# Patient Record
Sex: Male | Born: 1975 | Race: Black or African American | Hispanic: No | Marital: Single | Smoking: Never smoker
Health system: Southern US, Community
[De-identification: ages and names within clinical notes are randomized; demographics above are authoritative.]

## PROBLEM LIST (undated history)

## (undated) DIAGNOSIS — I10 Essential (primary) hypertension: Secondary | ICD-10-CM

## (undated) HISTORY — PX: ADENOIDECTOMY: SUR15

## (undated) HISTORY — PX: PALATE / UVULA BIOPSY / EXCISION: SUR128

---

## 2017-11-17 ENCOUNTER — Emergency Department (HOSPITAL_BASED_OUTPATIENT_CLINIC_OR_DEPARTMENT_OTHER): Payer: Managed Care, Other (non HMO)

## 2017-11-17 ENCOUNTER — Other Ambulatory Visit: Payer: Self-pay

## 2017-11-17 ENCOUNTER — Emergency Department (HOSPITAL_BASED_OUTPATIENT_CLINIC_OR_DEPARTMENT_OTHER)
Admission: EM | Admit: 2017-11-17 | Discharge: 2017-11-17 | Disposition: A | Discharge status: Home / Self Care | Payer: Managed Care, Other (non HMO) | Attending: Emergency Medicine | Admitting: Emergency Medicine

## 2017-11-17 ENCOUNTER — Encounter (HOSPITAL_BASED_OUTPATIENT_CLINIC_OR_DEPARTMENT_OTHER): Payer: Self-pay | Admitting: Emergency Medicine

## 2017-11-17 DIAGNOSIS — S93402A Sprain of unspecified ligament of left ankle, initial encounter: Secondary | ICD-10-CM | POA: Diagnosis not present

## 2017-11-17 DIAGNOSIS — Y929 Unspecified place or not applicable: Secondary | ICD-10-CM | POA: Insufficient documentation

## 2017-11-17 DIAGNOSIS — X509XXA Other and unspecified overexertion or strenuous movements or postures, initial encounter: Secondary | ICD-10-CM | POA: Insufficient documentation

## 2017-11-17 DIAGNOSIS — Y9384 Activity, sleeping: Secondary | ICD-10-CM | POA: Insufficient documentation

## 2017-11-17 DIAGNOSIS — Y999 Unspecified external cause status: Secondary | ICD-10-CM | POA: Diagnosis not present

## 2017-11-17 DIAGNOSIS — S99912A Unspecified injury of left ankle, initial encounter: Secondary | ICD-10-CM | POA: Diagnosis present

## 2017-11-17 DIAGNOSIS — I10 Essential (primary) hypertension: Secondary | ICD-10-CM | POA: Insufficient documentation

## 2017-11-17 HISTORY — DX: Essential (primary) hypertension: I10

## 2017-11-17 HISTORY — DX: Morbid (severe) obesity due to excess calories: E66.01

## 2017-11-17 MED ORDER — IBUPROFEN 200 MG PO TABS
600.0000 mg | ORAL_TABLET | Freq: Once | ORAL | Status: AC
  Administered 2017-11-17: 600 mg via ORAL
  Filled 2017-11-17: qty 1

## 2017-11-17 NOTE — Discharge Instructions (Signed)
Injury is likely musculoskeletal, likely an ankle sprain.  Please keep ankle wrapped in Ace bandage, elevate as much as possible, you can also apply ice.  The more you are able to stay off of your ankle though faster will likely improve.  If you have worsening swelling, pain in your calf, or on the inside of your ankle, or symptoms are not improving please follow-up.  May call to schedule an appointment with Dr. Norton BlizzardShane Hudnall with sports medicine.  If you have significantly worsened swelling, pain, or any shortness of breath or chest pain please return to the ED immediately.

## 2017-11-17 NOTE — ED Notes (Signed)
PMS intact before and after. Pt tolerated well. All questions answered. 
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
## 2017-11-17 NOTE — ED Provider Notes (Signed)
[facility] EMERGENCY DEPARTMENT Provider Note   CSN: [id number] Arrival date & time: 11/17/17  1956     History   Chief Complaint Chief Complaint  Patient presents with  . Leg Pain    HPI  Jeffrey Newton is a 41 y.o. Male history of hypertension and morbid obesity, presents complaining of left ankle pain for the past 2 weeks.  Patient reports he fell asleep in a kneeling position 2 weeks ago and since then his ankle has not felt right.  Pain localized primarily over the lateral malleolus and top of the foot.  Reports pain with range of motion and weightbearing.  Swelling over the lateral malleolus.  He denies swelling or pain in the calf, no pain at the knee or in the upper leg.  Denies numbness or tingling.  Tried ibuprofen with some improvement.  Has continued to walk on the leg, reports he is on his feet most of the day for work.      Past Medical History:  Diagnosis Date  . Hypertension   . Morbid obesity (HCC)     There are no active problems to display for this patient.   Past Surgical History:  Procedure Laterality Date  . ADENOIDECTOMY    . PALATE / UVULA BIOPSY / EXCISION         Home Medications    Prior to Admission medications   Not on File    Family History No family history on file.  Social History Social History   Tobacco Use  . Smoking status: Never Smoker  . Smokeless tobacco: Never Used  Substance Use Topics  . Alcohol use: Yes    Comment: occ  . Drug use: No     Allergies   Patient has no known allergies.   Review of Systems Review of Systems  Constitutional: Negative for chills and fever.  Musculoskeletal: Positive for arthralgias (L ankle).  Neurological: Negative for weakness and numbness.     Physical Exam Updated Vital Signs BP 136/84   Pulse 97   Temp 98 F (36.7 C) (Oral)   Resp 20   Ht 5\' 9"  (1.753 m)   Wt (!) 208.7 kg (460 lb)   SpO2 96%   BMI 67.93 kg/m   Physical Exam  Constitutional: He  is oriented to person, place, and time. He appears well-developed and well-nourished. No distress.  HENT:  Head: Normocephalic and atraumatic.  Eyes: Right eye exhibits no discharge. Left eye exhibits no discharge.  Cardiovascular:  Pulses:      Dorsalis pedis pulses are 2+ on the right side, and 2+ on the left side.       Posterior tibial pulses are 2+ on the right side, and 2+ on the left side.  Pulmonary/Chest: Effort normal. No respiratory distress.  Musculoskeletal:  Tenderness and swelling over left lateral malleolus, no tenderness or swelling over the medial malleolus calcaneus, Achilles tendon intact, no obvious joint laxity, dorsi and plantar flexion intact with some discomfort, DP and TP pulses 2+ and equal bilaterally, sensation intact and equal bilaterally  Neurological: He is alert and oriented to person, place, and time. Coordination normal.  Skin: Skin is warm and dry. Capillary refill takes less than 2 seconds. He is not diaphoretic.  Psychiatric: He has a normal mood and affect. His behavior is normal.  Nursing note and vitals reviewed.    ED Treatments / Results  Labs (all labs ordered are listed, but only abnormal results are displayed) Labs Reviewed -  No data to display  EKG  EKG Interpretation None       Radiology Dg Ankle Complete Left  Result Date: 11/17/2017 CLINICAL DATA:  Ankle pain for 1-2 weeks, no known injury, initial encounter EXAM: LEFT ANKLE COMPLETE - 3+ VIEW COMPARISON:  None. FINDINGS: Generalized soft tissue swelling is noted about the ankle joint. No acute fracture or dislocation is seen. Achilles spurring is noted. Mild degenerative changes of the tarsal bones are seen. IMPRESSION: Generalized soft tissue swelling without acute bony abnormality. Electronically Signed   By: Alcide CleverMark  Lukens M.D.   On: 11/17/2017 21:58    Procedures Procedures (including critical care time)  Medications Ordered in ED Medications  ibuprofen (ADVIL,MOTRIN)  tablet 600 mg (600 mg Oral Given 11/17/17 2154)     Initial Impression / Assessment and Plan / ED Course  I have reviewed the triage vital signs and the nursing notes.  Pertinent labs & imaging results that were available during my care of the patient were reviewed by me and considered in my medical decision making (see chart for details).  Patient presents with 2 weeks of left ankle pain and swelling over the lateral malleolus.  Patient reports he fell asleep in a kneeling position and has been hurting since then.  Left lower extremity is neurovascularly intact, no swelling over the medial malleolus, equal pulses, low suspicion for DVT.  X-Ray neg for fracture but does show soft tissue swelling.  Likely ankle sprain as tenderness is primarily over the anterior talofibular ligament distribution, will place patient in an Ace wrap, recommended crutches but patient refuses.  Pain improving with ibuprofen here in the ED.  Encourage rice therapy and NSAIDs.  Patient to follow-up with Dr. Norton BlizzardShane Hudnall if symptoms are not improving.  Discussed return precautions regarding DVT.  Patient expresses understanding and is in agreement with plan.  Final Clinical Impressions(s) / ED Diagnoses   Final diagnoses:  Sprain of left ankle, unspecified ligament, initial encounter    ED Discharge Orders    None       Dartha LodgeFord, Travanti Mcmanus N, [place]PA-C 11/18/17 [id number]    Alvira MondaySchlossman, Erin, MD 11/19/17 [id number]

## 2017-12-17 ENCOUNTER — Encounter: Payer: Self-pay | Admitting: Physician Assistant

## 2017-12-17 ENCOUNTER — Other Ambulatory Visit: Payer: Self-pay

## 2017-12-17 ENCOUNTER — Ambulatory Visit (INDEPENDENT_AMBULATORY_CARE_PROVIDER_SITE_OTHER): Payer: Managed Care, Other (non HMO) | Admitting: Physician Assistant

## 2017-12-17 VITALS — BP 156/100 | HR 104 | Temp 97.8°F | Resp 18 | Ht 68.5 in | Wt >= 6400 oz

## 2017-12-17 DIAGNOSIS — M546 Pain in thoracic spine: Secondary | ICD-10-CM | POA: Diagnosis not present

## 2017-12-17 DIAGNOSIS — R030 Elevated blood-pressure reading, without diagnosis of hypertension: Secondary | ICD-10-CM | POA: Diagnosis not present

## 2017-12-17 MED ORDER — CYCLOBENZAPRINE HCL 10 MG PO TABS: 5.0000 mg | ORAL_TABLET | Freq: Three times a day (TID) | ORAL | 0 refills | Status: AC | PRN

## 2017-12-17 MED ORDER — IBUPROFEN 600 MG PO TABS: 600.0000 mg | ORAL_TABLET | Freq: Three times a day (TID) | ORAL | 0 refills | Status: AC | PRN

## 2017-12-17 NOTE — Patient Instructions (Signed)
     IF you received an x-ray today, you will receive an invoice from [facility]. Please contact [facility] at [phone] with questions or concerns regarding your invoice.   IF you received labwork today, you will receive an invoice from [facility]. Please contact [facility] at [phone] with questions or concerns regarding your invoice.   Our billing staff will not be able to assist you with questions regarding bills from these companies.  You will be contacted with the lab results as soon as they are available. The fastest way to get your results is to activate your My Chart account. Instructions are located on the last page of this paperwork. If you have not heard from us regarding the results in 2 weeks, please contact this office.     

## 2017-12-17 NOTE — Progress Notes (Deleted)
    12/17/2017 3:41 PM   DOB: 1976-04-16 / MRN: [id number]  SUBJECTIVE:  Jeffrey Newton is a 42 y.o. male presenting for   He has No Known Allergies.   He  has a past medical history of Hypertension and Morbid obesity (HCC).    He  reports that  has never smoked. he has never used smokeless tobacco. He reports that he drinks alcohol. He reports that he does not use drugs. He  has no sexual activity history on file. The patient  has a past surgical history that includes Adenoidectomy and Palate / uvula biopsy / excision.  His family history includes Cancer in his father and mother; Diabetes in his mother.  ROS  The problem list and medications were reviewed and updated by myself where necessary and exist elsewhere in the encounter.   OBJECTIVE:  BP (!) 152/92   Pulse (!) 111   Temp 97.8 F (36.6 C) (Oral)   Resp 18   Ht 5' 8.5" (1.74 m)   Wt (!) 449 lb 3.2 oz (203.8 kg)   SpO2 95%   BMI 67.30 kg/m   Physical Exam  No results found for this or any previous visit (from the past 72 hour(s)).  No results found.  ASSESSMENT AND PLAN:  There are no diagnoses linked to this encounter.  The patient is advised to call or return to clinic if he does not see an improvement in symptoms, or to seek the care of the closest emergency department if he worsens with the above plan.   Deliah BostonMichael Masaichi Kracht, MHS, PA-C Primary Care at [facility] 12/17/2017 3:41 PM
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
## 2017-12-24 ENCOUNTER — Ambulatory Visit (HOSPITAL_COMMUNITY)
Admission: RE | Admit: 2017-12-24 | Discharge: 2017-12-24 | Disposition: A | Discharge status: Home / Self Care | Payer: Managed Care, Other (non HMO) | Source: Ambulatory Visit | Attending: Chiropractic Medicine | Admitting: Chiropractic Medicine

## 2017-12-24 ENCOUNTER — Other Ambulatory Visit (HOSPITAL_COMMUNITY): Payer: Self-pay | Admitting: Chiropractic Medicine

## 2017-12-24 DIAGNOSIS — M546 Pain in thoracic spine: Secondary | ICD-10-CM | POA: Insufficient documentation

## 2017-12-24 DIAGNOSIS — M545 Low back pain: Secondary | ICD-10-CM | POA: Insufficient documentation

## 2017-12-24 DIAGNOSIS — M5440 Lumbago with sciatica, unspecified side: Secondary | ICD-10-CM

## 2017-12-24 DIAGNOSIS — M542 Cervicalgia: Secondary | ICD-10-CM | POA: Diagnosis not present

## 2017-12-24 DIAGNOSIS — M4854XA Collapsed vertebra, not elsewhere classified, thoracic region, initial encounter for fracture: Secondary | ICD-10-CM | POA: Insufficient documentation
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
